# Patient Record
Sex: Male | Born: 1985 | Race: Black or African American | Hispanic: No | Marital: Married | Smoking: Never smoker
Health system: Southern US, Community
[De-identification: ages and names within clinical notes are randomized; demographics above are authoritative.]

## PROBLEM LIST (undated history)

## (undated) DIAGNOSIS — G8929 Other chronic pain: Secondary | ICD-10-CM

## (undated) DIAGNOSIS — R42 Dizziness and giddiness: Secondary | ICD-10-CM

## (undated) DIAGNOSIS — R519 Headache, unspecified: Secondary | ICD-10-CM

## (undated) DIAGNOSIS — M479 Spondylosis, unspecified: Secondary | ICD-10-CM

## (undated) HISTORY — DX: Other chronic pain: G89.29

## (undated) HISTORY — DX: Dizziness and giddiness: R42

## (undated) HISTORY — DX: Spondylosis, unspecified: M47.9

## (undated) HISTORY — DX: Headache, unspecified: R51.9

---

## 2020-10-23 DIAGNOSIS — Z111 Encounter for screening for respiratory tuberculosis: Secondary | ICD-10-CM | POA: Diagnosis not present

## 2020-10-23 DIAGNOSIS — Z23 Encounter for immunization: Secondary | ICD-10-CM | POA: Diagnosis not present

## 2020-11-12 ENCOUNTER — Ambulatory Visit
Admission: RE | Admit: 2020-11-12 | Discharge: 2020-11-12 | Disposition: A | Discharge status: Home / Self Care | Payer: No Typology Code available for payment source | Source: Ambulatory Visit | Attending: Obstetrics and Gynecology | Admitting: Obstetrics and Gynecology

## 2020-11-12 ENCOUNTER — Other Ambulatory Visit: Payer: Self-pay | Admitting: Obstetrics and Gynecology

## 2020-11-12 DIAGNOSIS — R7611 Nonspecific reaction to tuberculin skin test without active tuberculosis: Secondary | ICD-10-CM

## 2020-11-25 DIAGNOSIS — Z23 Encounter for immunization: Secondary | ICD-10-CM | POA: Diagnosis not present

## 2021-02-12 DIAGNOSIS — R42 Dizziness and giddiness: Secondary | ICD-10-CM | POA: Diagnosis not present

## 2021-02-12 DIAGNOSIS — M479 Spondylosis, unspecified: Secondary | ICD-10-CM | POA: Diagnosis not present

## 2021-02-12 DIAGNOSIS — R519 Headache, unspecified: Secondary | ICD-10-CM | POA: Diagnosis not present

## 2021-03-20 DIAGNOSIS — H5211 Myopia, right eye: Secondary | ICD-10-CM | POA: Diagnosis not present

## 2021-03-20 DIAGNOSIS — H5213 Myopia, bilateral: Secondary | ICD-10-CM | POA: Diagnosis not present

## 2021-03-26 ENCOUNTER — Encounter: Payer: Self-pay | Admitting: *Deleted

## 2021-03-31 ENCOUNTER — Ambulatory Visit: Payer: BC Managed Care – PPO | Admitting: Psychiatry

## 2021-04-03 ENCOUNTER — Encounter: Payer: Self-pay | Admitting: Psychiatry

## 2021-04-03 ENCOUNTER — Ambulatory Visit: Payer: BC Managed Care – PPO | Admitting: Psychiatry

## 2021-04-03 VITALS — BP 132/75 | HR 85 | Wt 172.0 lb

## 2021-04-03 DIAGNOSIS — M5412 Radiculopathy, cervical region: Secondary | ICD-10-CM

## 2021-04-03 DIAGNOSIS — M5481 Occipital neuralgia: Secondary | ICD-10-CM

## 2021-04-03 DIAGNOSIS — G4452 New daily persistent headache (NDPH): Secondary | ICD-10-CM

## 2021-04-03 DIAGNOSIS — G43719 Chronic migraine without aura, intractable, without status migrainosus: Secondary | ICD-10-CM | POA: Diagnosis not present

## 2021-04-03 MED ORDER — METHYLPREDNISOLONE 4 MG PO TBPK: ORAL_TABLET | ORAL | 0 refills | Status: DC

## 2021-04-03 NOTE — Patient Instructions (Addendum)
MRI of the head and neck Referral to physical therapy Short course of steroids to help with headaches until we start Botox Start Botox for headache prevention

## 2021-04-03 NOTE — Progress Notes (Signed)
Referring:  Starr Sinclair, MD [address] [place],  [place] [address]  PCP: Starr Sinclair, MD  Neurology was asked to evaluate Raymond Boyd, a 36 year old male for a chief complaint of headaches.  Our recommendations of care will be communicated by shared medical record.    CC:  headaches  HPI:  Medical co-morbidities: none  The patient presents for evaluation of daily headaches which began in 2020. Headaches are described as unilateral throbbing pain R>L. They are associated with photophobia and nausea. He will also sometimes have associated dizziness and vertigo. Headaches can last all day. He also has sharp headaches which can last 3-5 minutes at a time and can occur up to 5 times a day. Feels like he has a foreign body in his left eye, which will occasionally get red and tear up. Went to an eye doctor who told him he had dry eyes.  He also endorses significant neck pain with shooting pain down and numbness down his left arm.  Has tried amitriptyline, topamax, propranolol, and tegretol with temporary relief. He is now just taking a magnesium supplement. Tylenol does not help.  Headache History: Onset: 2020 Triggers: bright lights Aura: no Location: unilateral temple R>L Quality/Description: throbbing Severity: 7/10 Associated Symptoms:  Photophobia: yes  Phonophobia: no  Nausea: yes Vomiting: no Other symptoms: vertigo Worse with activity?: yes Duration of headaches: Hours. Some headaches are 3-5 minutes, can occur 5 times per day.  Headache days per month: 30 Headache free days per month: 0  Current Treatment: Abortive tylenol  Preventative magnesium  Prior Therapies                                 Propranolol Amitriptyline Tegretol Tylenol naproxen Topamax  Headache Risk Factors: Headache risk factors and/or co-morbidities (+) Neck Pain (+) Back Pain (-) History of Traumatic Brain Injury and/or Concussion  LABS: none  IMAGING:   none   Current Outpatient Medications on File Prior to Visit  Medication Sig Dispense Refill   RA NATURAL MAGNESIUM 250 MG TABS 500 mg at bedtime.     No current facility-administered medications on file prior to visit.     Allergies: Allergies  Allergen Reactions   Chloroquine     Family History: Migraine or other headaches in the family:  mother has headaches Aneurysms in a first degree relative:  no Brain tumors in the family:  no Other neurological illness in the family:   no  Past Medical History: Past Medical History:  Diagnosis Date   Chronic headache    Spondylosis    Vertigo     Past Surgical History History reviewed. No pertinent surgical history.  Social History: Social History   Tobacco Use   Smoking status: Never   Smokeless tobacco: Never  Substance Use Topics   Alcohol use: Never   Drug use: Never    ROS: Negative for fevers, chills. Positive for headaches, neck pain. All other systems reviewed and negative unless stated otherwise in HPI.   Physical Exam:   Vital Signs: BP 132/75    Pulse 85    Wt 172 lb (78 kg)  GENERAL: well appearing,in no acute distress,alert SKIN:  Color, texture, turgor normal. No rashes or lesions HEAD:  Normocephalic/atraumatic. CV:  RRR RESP: Normal respiratory effort MSK: no tenderness to palpation over occiput, neck, or shoulders  NEUROLOGICAL: Mental Status: Alert, oriented to person, place and time,Follows commands Cranial  Nerves: PERRL,visual fields intact to confrontation,extraocular movements intact,facial sensation intact,no facial droop or ptosis,hearing grossly intact,no dysarthria Motor: muscle strength 5/5 both upper and lower extremities,no drift, normal tone Reflexes: 2+ throughout Sensation: intact to light touch all 4 extremities Coordination: Finger-to- nose-finger intact bilaterally Gait: normal-based   IMPRESSION: 36 year old male who presents for evaluation of daily headaches. His  headaches have migrainous features including photophobia and nausea. Suspect his shorter headaches are secondary to occipital neuralgia. He does endorse unilateral autonomic symptoms, however they are in the left eye which is the opposite side of his headaches. MRI brain ordered to assess for underlying structural causes of daily right sided headaches. Will also order MRI C-spine as he also endorses radicular symptoms. Referral to neck PT placed to help with his cervicalgia. He has failed multiple oral migraine medications due to lack of efficacy. Will start Botox as this can help with both headaches and neck pain. Medrol dosepak prescribed to help reduce headaches while waiting for his first Botox session.  PLAN: -MRI brain and C-spine -Medrol dosepak -Preventive: Start Botox -Referral to neck PT -Next steps: consider occipital nerve block, gabapentin, CGRP. Consider indomethacin if no improvement despite treatment  I spent a total of 42 minutes chart reviewing and counseling the patient. Headache education was done. Discussed treatment options including preventive and acute medications, and physical therapy. Discussed medication side effects, adverse reactions and drug interactions. Written educational materials and patient instructions outlining all of the above were given.  Follow-up: for Botox   Ocie Doyne, MD 04/03/2021   9:48 AM

## 2021-04-07 ENCOUNTER — Telehealth: Payer: Self-pay | Admitting: Psychiatry

## 2021-04-07 NOTE — Telephone Encounter (Signed)
Dr. Delena Bali would like to start this patient on Botox ([id number], [phone]) for migraine. Dx: G81.856. Patient currently has [employer].

## 2021-04-07 NOTE — Telephone Encounter (Signed)
spoke to the patient he is going to have to think about it due to the cost  [employer] auth: [id number] (exp. 04/04/21 to 05/03/21)

## 2021-04-07 NOTE — Telephone Encounter (Signed)
Completed [employer] PA form for Botox. Placed in Nurse Pod for signature.

## 2021-04-08 NOTE — Telephone Encounter (Signed)
Faxed signed PA form with OV notes to [facility]. Upon approval, patient's Botox prescription will need to be sent to [facility]. Patient will need to sign consent at first injection.

## 2021-04-11 DIAGNOSIS — R1084 Generalized abdominal pain: Secondary | ICD-10-CM | POA: Diagnosis not present

## 2021-04-14 NOTE — Telephone Encounter (Signed)
Received denial from [facility] for Botox. Request does not meet the definition of Medical Necessity. Patient will need to have tried and failed therapy with a CGRP such as Emgailty or Aimovig to prevent migraines. An appeal can be submitted.

## 2021-04-15 ENCOUNTER — Other Ambulatory Visit: Payer: Self-pay | Admitting: Psychiatry

## 2021-04-15 ENCOUNTER — Telehealth: Payer: Self-pay

## 2021-04-15 MED ORDER — AJOVY 225 MG/1.5ML ~~LOC~~ SOAJ: 1.0000 "pen " | SUBCUTANEOUS | 3 refills | Status: DC

## 2021-04-15 MED ORDER — EMGALITY 120 MG/ML ~~LOC~~ SOAJ: 2.0000 "pen " | Freq: Once | SUBCUTANEOUS | 0 refills | Status: AC

## 2021-04-15 MED ORDER — EMGALITY 120 MG/ML ~~LOC~~ SOAJ: 1.0000 "pen " | SUBCUTANEOUS | 3 refills | Status: DC

## 2021-04-15 NOTE — Telephone Encounter (Signed)
Called patient and informed him his insurance denied Botox, explained CGRP Rx and that it will need PA. Advised there are clear directions in box, educated on loading dose. He stated he will call if he has any questions. He then asked about MRI costs. I advised him that Raquel Sarna spoke with him on 04/07/21 and he was going to think about MRI. He still has questions regarding his insurance, cost and scheduling. I advised I will ask Raquel Sarna to all him. Patient verbalized understanding, appreciation.

## 2021-04-15 NOTE — Telephone Encounter (Signed)
I have submitted a PA request for Emgality 120mg /mL on CMM, Key: [id number]. Awaiting determination from [employer].

## 2021-04-15 NOTE — Telephone Encounter (Signed)
We can try a CGRP first. I'll send in a prescription for Emgality to his pharmacy

## 2021-04-15 NOTE — Telephone Encounter (Signed)
LVM requesting he call back for prescription information.

## 2021-04-21 NOTE — Telephone Encounter (Signed)
Patient returned my call he is scheduled at [facility] for 04/29/21.

## 2021-04-21 NOTE — Telephone Encounter (Signed)
Emgality approved, starter kit included in approval. Dates 04/15/21- 07/07/21. Approval letter faxed to pharmacy.

## 2021-04-21 NOTE — Telephone Encounter (Signed)
LVM for pt to call back to schedule.

## 2021-04-21 NOTE — Telephone Encounter (Signed)
Patient is scheduled at [facility] for 04/29/21.

## 2021-04-22 ENCOUNTER — Ambulatory Visit: Payer: BC Managed Care – PPO | Admitting: Psychiatry

## 2021-04-29 ENCOUNTER — Ambulatory Visit: Payer: BC Managed Care – PPO

## 2021-04-29 DIAGNOSIS — G4452 New daily persistent headache (NDPH): Secondary | ICD-10-CM

## 2021-04-29 DIAGNOSIS — M5412 Radiculopathy, cervical region: Secondary | ICD-10-CM | POA: Diagnosis not present

## 2021-04-29 MED ORDER — GADOBENATE DIMEGLUMINE 529 MG/ML IV SOLN
15.0000 mL | Freq: Once | INTRAVENOUS | Status: AC | PRN
  Administered 2021-04-29: 15 mL via INTRAVENOUS

## 2021-05-27 DIAGNOSIS — Z23 Encounter for immunization: Secondary | ICD-10-CM | POA: Diagnosis not present

## 2021-06-23 ENCOUNTER — Telehealth: Payer: Self-pay | Admitting: Psychiatry

## 2021-06-23 NOTE — Telephone Encounter (Signed)
Pt called and LVM stating that he was returning a call to provider Dr. Delena Bali that he received last week. Please advise.  ?

## 2021-06-24 ENCOUNTER — Other Ambulatory Visit: Payer: Self-pay | Admitting: Psychiatry

## 2021-06-24 MED ORDER — RIZATRIPTAN BENZOATE 10 MG PO TABS: 10.0000 mg | ORAL_TABLET | ORAL | 3 refills | Status: DC | PRN

## 2021-06-24 NOTE — Telephone Encounter (Signed)
Contacted pt, informed him there was no call or VM left for him, only results of the scans were sent via mychart. Pt wanted to inform MD that he took his injection on Sunday 4/9 and is still having headaches and wanted to know if there was something else he could do or take to resolve current headache. Please advise  ?

## 2021-06-24 NOTE — Telephone Encounter (Signed)
Pt states he has some questions and would like to talk to a nurse.  ?

## 2021-06-24 NOTE — Telephone Encounter (Signed)
I'm not sure what call he is referring to. I don't see anything in the chart from last week. He can reach out if he has any new concerns, otherwise I don't have any updates for him

## 2021-06-24 NOTE — Telephone Encounter (Signed)
Contacted pt back, informed him rx for Maxalt sent to his pharmacy. He can take this as needed for migraines. Can repeat a dose in 2 hours if headache persists. He should limit Maxalt use to 2 days per week to avoid rebound headaches. Advised to call office back with questions or concerns  ?

## 2021-06-24 NOTE — Telephone Encounter (Signed)
I sent in an rx for Maxalt to his pharmacy. He can take this as needed for migraines. Can repeat a dose in 2 hours if headache persists. He should limit Maxalt use to 2 days per week to avoid rebound headaches

## 2021-06-25 DIAGNOSIS — Z1152 Encounter for screening for COVID-19: Secondary | ICD-10-CM | POA: Diagnosis not present

## 2021-06-25 DIAGNOSIS — U071 COVID-19: Secondary | ICD-10-CM | POA: Diagnosis not present

## 2021-09-19 DIAGNOSIS — H02054 Trichiasis without entropian left upper eyelid: Secondary | ICD-10-CM | POA: Diagnosis not present

## 2021-09-19 DIAGNOSIS — H5713 Ocular pain, bilateral: Secondary | ICD-10-CM | POA: Diagnosis not present

## 2021-09-19 DIAGNOSIS — H02051 Trichiasis without entropian right upper eyelid: Secondary | ICD-10-CM | POA: Diagnosis not present

## 2022-02-09 DIAGNOSIS — M545 Low back pain, unspecified: Secondary | ICD-10-CM | POA: Diagnosis not present

## 2022-02-09 DIAGNOSIS — F430 Acute stress reaction: Secondary | ICD-10-CM | POA: Diagnosis not present

## 2022-02-09 DIAGNOSIS — R1030 Lower abdominal pain, unspecified: Secondary | ICD-10-CM | POA: Diagnosis not present

## 2022-05-14 DIAGNOSIS — R5383 Other fatigue: Secondary | ICD-10-CM | POA: Diagnosis not present

## 2022-05-14 DIAGNOSIS — Z1152 Encounter for screening for COVID-19: Secondary | ICD-10-CM | POA: Diagnosis not present

## 2022-05-14 DIAGNOSIS — J00 Acute nasopharyngitis [common cold]: Secondary | ICD-10-CM | POA: Diagnosis not present

## 2022-05-14 DIAGNOSIS — Z131 Encounter for screening for diabetes mellitus: Secondary | ICD-10-CM | POA: Diagnosis not present

## 2022-05-14 DIAGNOSIS — E559 Vitamin D deficiency, unspecified: Secondary | ICD-10-CM | POA: Diagnosis not present

## 2022-08-03 DIAGNOSIS — R079 Chest pain, unspecified: Secondary | ICD-10-CM | POA: Diagnosis not present

## 2022-08-03 DIAGNOSIS — E559 Vitamin D deficiency, unspecified: Secondary | ICD-10-CM | POA: Diagnosis not present

## 2022-08-14 ENCOUNTER — Ambulatory Visit: Payer: BC Managed Care – PPO | Admitting: Cardiology

## 2022-08-14 ENCOUNTER — Encounter: Payer: Self-pay | Admitting: Cardiology

## 2022-08-14 VITALS — BP 128/81 | HR 60 | Ht 69.0 in | Wt 173.0 lb

## 2022-08-14 DIAGNOSIS — R002 Palpitations: Secondary | ICD-10-CM

## 2022-08-14 DIAGNOSIS — R9431 Abnormal electrocardiogram [ECG] [EKG]: Secondary | ICD-10-CM | POA: Diagnosis not present

## 2022-08-14 DIAGNOSIS — M940 Chondrocostal junction syndrome [Tietze]: Secondary | ICD-10-CM

## 2022-08-14 NOTE — Progress Notes (Unsigned)
Primary Physician/Referring:  Irwin Brakeman, FNP  Patient ID: Raymond Boyd, male    DOB: 06-03-1985, 37 y.o.   MRN: [id number]  Chief Complaint  Patient presents with   Non-cardiac chest pain   Nonspecific abnormal electrocardiogram (ECG) (EKG)   New Patient (Initial Visit)   HPI:    Raymond Boyd  is a 37 y.o. [place] male patient with no past medical history, except in 2020 while he was in [place] was told to have abnormal EKG that is going to be normal as he grows older, referred to me for evaluation of chest pain and palpitations.  Patient states that over the past several years she has had chest pain on the right side of his chest, able to point with his fingers, on and off however in the past couple months he has been having more frequent episodes of chest pain.  It is present with or without physical exertion and no other associated symptoms.  He also complains of palpitations mostly when he is resting or in the evening when he is laying down lasting a few seconds.  There is no family history of sudden cardiac death, no family history of premature coronary artery disease, he has no other symptoms of dyspnea, leg edema, dizziness or syncope.  Past Medical History:  Diagnosis Date   Chronic headache    Spondylosis    Vertigo    History reviewed. No pertinent surgical history. Family History  Problem Relation Age of Onset   Hypertension Mother    Hypertension Father    Diabetes Father    Hypertension Sister    Hypertension Sister     Social History   Tobacco Use   Smoking status: Never   Smokeless tobacco: Never  Substance Use Topics   Alcohol use: Never   Marital Status: Married  ROS  Review of Systems  Cardiovascular:  Positive for chest pain and palpitations. Negative for dyspnea on exertion and leg swelling.   Objective      08/14/2022    8:25 AM 04/03/2021    8:58 AM  Vitals with BMI  Height 5\' 9"    Weight 173 lbs 172 lbs  BMI 25.54   Systolic 128  132  Diastolic 81 75  Pulse 60 85   SpO2: 98 %   Physical Exam Neck:     Vascular: No carotid bruit or JVD.  Cardiovascular:     Rate and Rhythm: Normal rate and regular rhythm.     Pulses: Intact distal pulses.     Heart sounds: Normal heart sounds. No murmur heard.    No gallop.  Pulmonary:     Effort: Pulmonary effort is normal.     Breath sounds: Normal breath sounds.  Chest:     Chest wall: Tenderness (right costochondral junction) present.  Abdominal:     General: Bowel sounds are normal.     Palpations: Abdomen is soft.  Musculoskeletal:     Right lower leg: No edema.     Left lower leg: No edema.     Laboratory examination:   External labs:   NA  Radiology:    Cardiac Studies:  NA  EKG:   EKG 08/14/2022: Normal sinus rhythm at the rate of 63 bpm, normal axis, LVH however probably normal for age.  Compared to PCP EKG 08/03/2022, inferior nonspecific T abnormality not present. TWI only present in III.  PCP EKG 08/03/2022: Normal sinus rhythm at rate of 93 bpm, normal axis, inferior T wave abnormality cannot exclude  inferior ischemia.  LVH probably normal for age.  Early repolarization.  Normal QT interval at 314 ms.  Medications and allergies   Allergies  Allergen Reactions   Chloroquine      Medication list   Current Outpatient Medications:    rizatriptan (MAXALT) 10 MG tablet, Take 1 tablet (10 mg total) by mouth as needed for migraine. May repeat in 2 hours if needed. Max dose 2 pills in 24 hours, Disp: 10 tablet, Rfl: 3  Assessment     ICD-10-CM   1. Chondrocostal junction syndrome (tietze)  M94.0 PCV ECHOCARDIOGRAM COMPLETE    PCV CARDIAC STRESS TEST    2. Nonspecific abnormal electrocardiogram (ECG) (EKG)  R94.31 EKG 12-Lead    PCV ECHOCARDIOGRAM COMPLETE    PCV CARDIAC STRESS TEST    3. Palpitations  R00.2        Orders Placed This Encounter  Procedures   PCV CARDIAC STRESS TEST    Standing Status:   Future    Standing Expiration  Date:   10/14/2022   EKG 12-Lead   PCV ECHOCARDIOGRAM COMPLETE    Standing Status:   Future    Standing Expiration Date:   08/14/2023    No orders of the defined types were placed in this encounter.   Medications Discontinued During This Encounter  Medication Reason   Galcanezumab-gnlm (EMGALITY) 120 MG/ML SOAJ    methylPREDNISolone (MEDROL DOSEPAK) 4 MG TBPK tablet    RA NATURAL MAGNESIUM 250 MG TABS      Recommendations:   [name] is a 37 y.o. [place] male patient with no past medical history, except in 2020 while he was in [place] was told to have abnormal EKG that is going to be normal as he grows older, referred to me for evaluation of chest pain and palpitations.  1. Chondrocostal junction syndrome (tietze) Chest pain is clearly musculoskeletal and reproducible.  I have simply reassured him.  Advised him to use NSAIDs and heat and cold packs as necessary.  No contraindication for him to resume full activity.  - PCV ECHOCARDIOGRAM COMPLETE; Future - PCV CARDIAC STRESS TEST; Future  2. Nonspecific abnormal electrocardiogram (ECG) (EKG) He has an abnormal EKG.  In my experience I have had several people from [place] and [place] that have abnormal EKG similar to this but are not healthy overall.  There is no family history of sudden cardiac death, he is the youngest of the siblings, his oldest brother is 70 years of age and alive and healthy, I have advised the patient to ask his brother to see whether he also has an abnormal EKG.  I will be obtaining an echocardiogram and a routine treadmill exercise stress test and unless abnormal I will see him back on a as needed basis.  In the African health sciences literature, the predominance of EKG abnormalities more predominant in patients with metabolic syndrome.  Patient's diastolic blood pressure is slightly high, advised him to monitor his blood pressure closely.  He has not had lipid profile testing, also in the  literature, there is suggestion that HDL tends to be low in patients with abnormal EKG.  Overall at this point continue primary prevention is indicated.  - EKG 12-Lead - PCV ECHOCARDIOGRAM COMPLETE; Future - PCV CARDIAC STRESS TEST; Future  3. Palpitations Symptoms of palpitations are clearly suggestive of PACs and PVCs mostly occurring at rest and lasting a few seconds.  No further evaluation is indicated.     Yates Decamp, MD, [facility] 08/15/2022,  4:39 PM Office: [phone]

## 2022-08-14 NOTE — Patient Instructions (Signed)
Premature ventricular complexes (PVC) means extra heartbeat from the lower chamber of the heart.  These are very common and are not dangerous.  Extra skipped beat coming from the bottom chamber (ventricle) and mostly are life altering (nuisance) than life threatening and mostly treated by reassurance.  There may not be any specific reasons for this, however patients with excessive caffeine, anxiety, lack of sleep, alcohol or thyroid problems can have these episodes.  Rarely patients with block coronary arteries or family history of heart muscle disease may be the reason.  If more questions, he can discuss with her doctor on office visit.  Reasons for reproducible pain explained to the patient. Heat to tender area. Cold ice compressions to reduce inflammation. Recommended Aleve OTC 1-2 tablets BID for 3-4 days and PRN. Reassured the patient.

## 2022-08-25 DIAGNOSIS — E559 Vitamin D deficiency, unspecified: Secondary | ICD-10-CM | POA: Diagnosis not present

## 2022-08-25 DIAGNOSIS — R079 Chest pain, unspecified: Secondary | ICD-10-CM | POA: Diagnosis not present

## 2022-09-04 ENCOUNTER — Emergency Department (HOSPITAL_COMMUNITY)
Admission: EM | Admit: 2022-09-04 | Discharge: 2022-09-04 | Disposition: A | Discharge status: Home / Self Care | Payer: BC Managed Care – PPO | Attending: Emergency Medicine | Admitting: Emergency Medicine

## 2022-09-04 ENCOUNTER — Ambulatory Visit: Payer: BC Managed Care – PPO

## 2022-09-04 ENCOUNTER — Emergency Department (HOSPITAL_COMMUNITY): Payer: BC Managed Care – PPO

## 2022-09-04 ENCOUNTER — Other Ambulatory Visit: Payer: Self-pay

## 2022-09-04 ENCOUNTER — Encounter (HOSPITAL_COMMUNITY): Payer: Self-pay

## 2022-09-04 ENCOUNTER — Telehealth: Payer: Self-pay

## 2022-09-04 DIAGNOSIS — R918 Other nonspecific abnormal finding of lung field: Secondary | ICD-10-CM | POA: Diagnosis not present

## 2022-09-04 DIAGNOSIS — R0789 Other chest pain: Secondary | ICD-10-CM | POA: Diagnosis not present

## 2022-09-04 DIAGNOSIS — R079 Chest pain, unspecified: Secondary | ICD-10-CM | POA: Diagnosis not present

## 2022-09-04 DIAGNOSIS — R000 Tachycardia, unspecified: Secondary | ICD-10-CM | POA: Diagnosis not present

## 2022-09-04 DIAGNOSIS — M940 Chondrocostal junction syndrome [Tietze]: Secondary | ICD-10-CM

## 2022-09-04 DIAGNOSIS — M549 Dorsalgia, unspecified: Secondary | ICD-10-CM | POA: Diagnosis not present

## 2022-09-04 DIAGNOSIS — R9431 Abnormal electrocardiogram [ECG] [EKG]: Secondary | ICD-10-CM | POA: Diagnosis not present

## 2022-09-04 LAB — CBC
HCT: 43.3 % (ref 39.0–52.0)
Hemoglobin: 14.4 g/dL (ref 13.0–17.0)
MCH: 25.2 pg — ABNORMAL LOW (ref 26.0–34.0)
MCHC: 33.3 g/dL (ref 30.0–36.0)
MCV: 75.8 fL — ABNORMAL LOW (ref 80.0–100.0)
Platelets: 231 10*3/uL (ref 150–400)
RBC: 5.71 MIL/uL (ref 4.22–5.81)
RDW: 12.7 % (ref 11.5–15.5)
WBC: 3.9 10*3/uL — ABNORMAL LOW (ref 4.0–10.5)
nRBC: 0 % (ref 0.0–0.2)

## 2022-09-04 LAB — BASIC METABOLIC PANEL
Anion gap: 9 (ref 5–15)
BUN: 18 mg/dL (ref 6–20)
CO2: 23 mmol/L (ref 22–32)
Calcium: 9.8 mg/dL (ref 8.9–10.3)
Chloride: 104 mmol/L (ref 98–111)
Creatinine, Ser: 1.24 mg/dL (ref 0.61–1.24)
GFR, Estimated: >60 mL/min (ref >60)
Glucose, Bld: 95 mg/dL (ref 70–99)
Potassium: 4.1 mmol/L (ref 3.5–5.1)
Sodium: 136 mmol/L (ref 135–145)

## 2022-09-04 LAB — TROPONIN I (HIGH SENSITIVITY)
Troponin I (High Sensitivity): 4 ng/L (ref <18)
Troponin I (High Sensitivity): 4 ng/L (ref <18)
Troponin I (High Sensitivity): 6 ng/L (ref <18)

## 2022-09-04 MED ORDER — NITROGLYCERIN 0.4 MG SL SUBL
0.4000 mg | SUBLINGUAL_TABLET | Freq: Once | SUBLINGUAL | Status: AC
  Administered 2022-09-04: 0.4 mg via SUBLINGUAL
  Filled 2022-09-04: qty 1

## 2022-09-04 MED ORDER — IOHEXOL 350 MG/ML SOLN
75.0000 mL | Freq: Once | INTRAVENOUS | Status: AC | PRN
  Administered 2022-09-04: 75 mL via INTRAVENOUS

## 2022-09-04 MED ORDER — ASPIRIN 81 MG PO CHEW
324.0000 mg | CHEWABLE_TABLET | Freq: Once | ORAL | Status: AC
  Administered 2022-09-04: 324 mg via ORAL
  Filled 2022-09-04: qty 4

## 2022-09-04 NOTE — ED Provider Notes (Signed)
[place] EMERGENCY DEPARTMENT AT [facility] Provider Note   CSN: [id number] Arrival date & time: 09/04/22  1427     History  Chief Complaint  Patient presents with   Chest Pain    Raymond Boyd is a 37 y.o. male who presents to the ED at request of cardiology for evaluation for possible aortic dissection.  Patient tells me that he has had chest pain for at least the last year.  Describes it as on the right side of his chest, intermittent, and worse for the last 1 to 2 months.  He was evaluated outpatient by cardiology for this in May of this year.  He was told previously when he lived in [place] that he had an abnormal EKG.  No family history of sudden cardiac death.  No history of premature CAD.  Patient denies dyspnea, leg pain or swelling, syncope, lightheadedness, nausea, vomiting, diarrhea, abdominal pain, or other complaints today.  Overall, his chest pain is unchanged from that he is experienced before.  At previous cardiology visit, it was noted that his chest pain was reproducible to palpation and believed to be musculoskeletal.  An outpatient stress test and echocardiogram was ordered.  This was completed today and Dr. Odis Hollingshead with cardiology noted that patient had possible aortic dissection flap prompting him to send the patient to the ED to rule out aortic dissection though there is a low suspicion for this.  If this study is normal, he stated that patient can be discharged home with outpatient follow-up.      Home Medications Prior to Admission medications   Medication Sig Start Date End Date Taking? Authorizing Provider  rizatriptan (MAXALT) 10 MG tablet Take 1 tablet (10 mg total) by mouth as needed for migraine. May repeat in 2 hours if needed. Max dose 2 pills in 24 hours 06/24/21   Ocie Doyne, MD      Allergies    Chloroquine    Review of Systems   Review of Systems  All other systems reviewed and are negative.   Physical Exam Updated Vital  Signs BP (!) 132/91 (BP Location: Right Arm)   Pulse (!) 58   Temp (!) 97.5 F (36.4 C) (Oral)   Resp 19   Ht 5\' 9"  (1.753 m)   Wt 78.5 kg   SpO2 100%   BMI 25.55 kg/m  Physical Exam Vitals and nursing note reviewed.  Constitutional:      General: He is not in acute distress.    Appearance: Normal appearance. He is not ill-appearing, toxic-appearing or diaphoretic.  HENT:     Head: Normocephalic and atraumatic.     Mouth/Throat:     Mouth: Mucous membranes are moist.  Eyes:     Conjunctiva/sclera: Conjunctivae normal.  Neck:     Vascular: No JVD.     Trachea: No tracheal deviation.  Cardiovascular:     Rate and Rhythm: Regular rhythm. Tachycardia present.     Heart sounds: No murmur heard. Pulmonary:     Effort: Pulmonary effort is normal. No respiratory distress.     Breath sounds: Normal breath sounds. No stridor. No decreased breath sounds, wheezing, rhonchi or rales.  Chest:     Chest wall: Tenderness present. No mass, deformity, crepitus or edema.  Abdominal:     General: Abdomen is flat. There is no abdominal bruit.     Palpations: Abdomen is soft. There is no hepatomegaly, splenomegaly or mass.     Tenderness: There is no abdominal tenderness.  There is no guarding or rebound.  Musculoskeletal:        General: Normal range of motion.     Cervical back: Normal range of motion and neck supple.     Right lower leg: No tenderness. No edema.     Left lower leg: No tenderness. No edema.  Skin:    General: Skin is warm and dry.     Capillary Refill: Capillary refill takes less than 2 seconds.  Neurological:     General: No focal deficit present.     Mental Status: He is alert and oriented to person, place, and time. Mental status is at baseline.  Psychiatric:        Mood and Affect: Mood normal.        Behavior: Behavior normal.     ED Results / Procedures / Treatments   Labs (all labs ordered are listed, but only abnormal results are displayed) Labs Reviewed   CBC - Abnormal; Notable for the following components:      Result Value   WBC 3.9 (*)    MCV 75.8 (*)    MCH 25.2 (*)    All other components within normal limits  BASIC METABOLIC PANEL  TROPONIN I (HIGH SENSITIVITY)  TROPONIN I (HIGH SENSITIVITY)  TROPONIN I (HIGH SENSITIVITY)    EKG EKG Interpretation  Date/Time:  Friday September 04 2022 18:31:04 EDT Ventricular Rate:  61 PR Interval:  184 QRS Duration: 90 QT Interval:  363 QTC Calculation: 366 R Axis:   66 Text Interpretation: Sinus rhythm Consider left ventricular hypertrophy Lateral infarct, acute Anterior ST elevation, probably due to LVH >>> Acute MI <<< Confirmed by Bethann Berkshire [phone]) on 09/04/2022 7:03:30 PM  Radiology CT Angio Chest Aorta W and/or Wo Contrast  Result Date: 09/04/2022 CLINICAL DATA:  Chest pain, back pain EXAM: CT ANGIOGRAPHY CHEST WITH CONTRAST TECHNIQUE: Multidetector CT imaging of the chest was performed using the standard protocol during bolus administration of intravenous contrast. Multiplanar CT image reconstructions and MIPs were obtained to evaluate the vascular anatomy. RADIATION DOSE REDUCTION: This exam was performed according to the departmental dose-optimization program which includes automated exposure control, adjustment of the mA and/or kV according to patient size and/or use of iterative reconstruction technique. CONTRAST:  75mL OMNIPAQUE IOHEXOL 350 MG/ML SOLN COMPARISON:  Chest radiograph done earlier today FINDINGS: Cardiovascular: There is no demonstrable mural hematoma in thoracic aorta in the noncontrast images. There is homogeneous enhancement in thoracic aorta. There is no demonstrable intimal flap. Major branches of thoracic aorta and upper abdominal aorta are patent. There are no intraluminal filling defects in central pulmonary artery branches. Mediastinum/Nodes: No significant lymphadenopathy is seen. Lungs/Pleura: Small linear patchy infiltrates are seen in right lower lobe. There is  no pleural effusion or pneumothorax. Upper Abdomen: No acute findings are seen. Musculoskeletal: No acute findings are seen. Review of the MIP images confirms the above findings. IMPRESSION: There is no evidence of thoracic aortic dissection. There is no evidence of central pulmonary artery embolism. Small patchy infiltrates are seen in right lower lobe suggesting atelectasis/pneumonia or scarring. There is no evidence of any large focal pulmonary consolidation. There is no pleural effusion or pneumothorax. Electronically Signed   By: Ernie Avena M.D.   On: 09/04/2022 20:07   DG Chest 2 View  Result Date: 09/04/2022 CLINICAL DATA:  Provided history: Chest pain. EXAM: CHEST - 2 VIEW COMPARISON:  Prior chest radiographs 11/12/2020. FINDINGS: Heart size within normal limits. No appreciable airspace consolidation or pulmonary edema.  No evidence of pleural effusion or pneumothorax. No acute osseous abnormality identified. Mild dextrocurvature of the mid and lower thoracic spine IMPRESSION: 1. No evidence of active cardiopulmonary disease. 2. Mild dextrocurvature of the mid and lower thoracic spine. Electronically Signed   By: Jackey Loge D.O.   On: 09/04/2022 15:31    Procedures Procedures    Medications Ordered in ED Medications  aspirin chewable tablet 324 mg (324 mg Oral Given 09/04/22 1910)  nitroGLYCERIN (NITROSTAT) SL tablet 0.4 mg (0.4 mg Sublingual Given 09/04/22 1910)  iohexol (OMNIPAQUE) 350 MG/ML injection 75 mL (75 mLs Intravenous Contrast Given 09/04/22 1957)    ED Course/ Medical Decision Making/ A&P                             Medical Decision Making Amount and/or Complexity of Data Reviewed Labs: ordered. Decision-making details documented in ED Course. Radiology: ordered. Decision-making details documented in ED Course. ECG/medicine tests: ordered. Decision-making details documented in ED Course.  Risk OTC drugs. Prescription drug management.   Medical Decision  Making:   Raymond Boyd is a 37 y.o. male who presented to the ED today with chest pain detailed above.    Patient's presentation is complicated by their history of abnormal EKG, possible abnormal echo.  Complete initial physical exam performed, notably the patient  was in no acute distress.  He was mildly tachycardic with a regular rhythm but this resolved briefly after initial evaluation.  No lower extremity edema.  No calf tenderness.  No respiratory distress.  Abdomen soft and nontender without palpable masses.  Overall otherwise benign exam.    Reviewed and confirmed nursing documentation for past medical history, family history, social history.    Initial Assessment:   With the patient's presentation of chest pain, the emergent differential diagnosis of chest pain includes: Acute coronary syndrome, pericarditis, aortic dissection, pulmonary embolism, tension pneumothorax, and esophageal rupture.  I do not believe the patient has an emergent cause of chest pain, other urgent/non-acute considerations include, but are not limited to: chronic angina, aortic stenosis, cardiomyopathy, myocarditis, mitral valve prolapse, pulmonary hypertension, hypertrophic obstructive cardiomyopathy (HOCM), aortic insufficiency, right ventricular hypertrophy, pneumonia, pleuritis, bronchitis, pneumothorax, tumor, gastroesophageal reflux disease (GERD), esophageal spasm, Mallory-Weiss syndrome, peptic ulcer disease, biliary disease, pancreatitis, functional gastrointestinal pain, cervical or thoracic disk disease or arthritis, shoulder arthritis, costochondritis, subacromial bursitis, anxiety or panic attack, herpes zoster, breast disorders, chest wall tumors, thoracic outlet syndrome, mediastinitis.    Initial Plan:  Screening labs including CBC and Metabolic panel to evaluate for infectious or metabolic etiology of disease.  CXR to evaluate for structural/infectious intrathoracic pathology.  EKG and troponin to  evaluate for cardiac pathology CT scan to assess for aortic dissection and other intrathoracic pathology Objective evaluation as reviewed   Initial Study Results:   Laboratory  All laboratory results reviewed without evidence of clinically relevant pathology.    EKG EKG was reviewed independently. NSR at 61bpm. Marked as acute MI by EKG machine but in review with attending who co-signed this note appears to be more of a strain pattern in setting of normal troponins, will not call acute MI.    Radiology:  All images reviewed independently. Agree with radiology report at this time.   CT Angio Chest Aorta W and/or Wo Contrast  Result Date: 09/04/2022 CLINICAL DATA:  Chest pain, back pain EXAM: CT ANGIOGRAPHY CHEST WITH CONTRAST TECHNIQUE: Multidetector CT imaging of the chest was performed using the standard  protocol during bolus administration of intravenous contrast. Multiplanar CT image reconstructions and MIPs were obtained to evaluate the vascular anatomy. RADIATION DOSE REDUCTION: This exam was performed according to the departmental dose-optimization program which includes automated exposure control, adjustment of the mA and/or kV according to patient size and/or use of iterative reconstruction technique. CONTRAST:  75mL OMNIPAQUE IOHEXOL 350 MG/ML SOLN COMPARISON:  Chest radiograph done earlier today FINDINGS: Cardiovascular: There is no demonstrable mural hematoma in thoracic aorta in the noncontrast images. There is homogeneous enhancement in thoracic aorta. There is no demonstrable intimal flap. Major branches of thoracic aorta and upper abdominal aorta are patent. There are no intraluminal filling defects in central pulmonary artery branches. Mediastinum/Nodes: No significant lymphadenopathy is seen. Lungs/Pleura: Small linear patchy infiltrates are seen in right lower lobe. There is no pleural effusion or pneumothorax. Upper Abdomen: No acute findings are seen. Musculoskeletal: No acute  findings are seen. Review of the MIP images confirms the above findings. IMPRESSION: There is no evidence of thoracic aortic dissection. There is no evidence of central pulmonary artery embolism. Small patchy infiltrates are seen in right lower lobe suggesting atelectasis/pneumonia or scarring. There is no evidence of any large focal pulmonary consolidation. There is no pleural effusion or pneumothorax. Electronically Signed   By: Ernie Avena M.D.   On: 09/04/2022 20:07   DG Chest 2 View  Result Date: 09/04/2022 CLINICAL DATA:  Provided history: Chest pain. EXAM: CHEST - 2 VIEW COMPARISON:  Prior chest radiographs 11/12/2020. FINDINGS: Heart size within normal limits. No appreciable airspace consolidation or pulmonary edema. No evidence of pleural effusion or pneumothorax. No acute osseous abnormality identified. Mild dextrocurvature of the mid and lower thoracic spine IMPRESSION: 1. No evidence of active cardiopulmonary disease. 2. Mild dextrocurvature of the mid and lower thoracic spine. Electronically Signed   By: Jackey Loge D.O.   On: 09/04/2022 15:31      Consults: Case discussed with Dr. Odis Hollingshead with cardiology who recommended dissection study and if normal patient can be discharged home with outpatient follow-up.   Final Assessment and Plan:   37 year old male presents to the ED with intermittent, chronic chest pain.  Evaluated by cardiology outpatient and sent to the ED after questionable abnormal echocardiogram for rule out of aortic dissection.  Troponins within normal limits.  Discussed EKG with attending physician who co-signed this note who agreed changes appear chronic and does not appear consistent with acute STEMI. Blood work also unremarkable.  Chest x-ray normal.  Patient's vital signs have been reassuring.  He continues to complain of minimal right-sided chest pain but this appears to be unchanged from the pain he has been experiencing for a while now.  CTA does not show evidence  of aortic dissection.  Questionable infiltrates noted to the right lung.  No recent cough, fever.  No shortness of breath.  No known sick contacts.  Patient aware of finding and will follow-up closely with his PCP.  Will not treat with antibiotics at this time as patient is essentially asymptomatic.  Patient instructed to follow-up closely with his PCP and outpatient cardiologist.  Will make outpatient cardiology team aware of findings of patient scan today.  Patient given strict ED return precautions, all questions answered, and stable for discharge.   Clinical Impression:  1. Atypical chest pain   2. Chest wall pain      Discharge           Final Clinical Impression(s) / ED Diagnoses Final diagnoses:  Atypical chest pain  Chest wall pain    Rx / DC Orders ED Discharge Orders     None         Richardson Dopp 09/04/22 2030    Bethann Berkshire, MD 09/08/22 1141

## 2022-09-04 NOTE — Discharge Instructions (Signed)
Thank you for letting us take care of you today.  Your workup was reassuring.  Your heart enzymes were normal.  The CT scan that we did of your chest did not show any major concerns.  Please follow-up with your PCP early next week for reevaluation.  Please continue to follow-up with your cardiologist for further discussion of any continued chest pain or concerns as well as results of your echocardiogram and stress test.  For any new symptoms including worsening chest pain or other new concerns please return to the nearest ED for reevaluation.

## 2022-09-04 NOTE — Telephone Encounter (Signed)
Patient came in today for echocardiogram and stress test.  Echocardiogram was concerning for possible aortic dissection flap - no obvious aortic dilatation but does have mild AI.  The finding was noted on orthogonal views.  Clinically hemodynamically stable but has been having anterior chest pain that goes to the back.  Blood pressures have been within normal limits.  Explained to the patient that the echo findings could be secondary to pathology versus artifact.  But given the symptoms, echo findings, it would be more beneficial to have it evaluated.  Patient was requested to go to ED via EMS for more expedited evaluation but refused.  Since he did not show up in the ED office staff reached out to him and re conveyed its importance.  Katiya Fike Banks, DO, [facility]

## 2022-09-04 NOTE — ED Notes (Signed)
Patient verbalizes understanding of discharge instructions. Opportunity for questioning and answers were provided. Armband removed by staff, pt discharged from ED. Pt ambulatory to ED waiting room with steady gait.  

## 2022-09-04 NOTE — ED Triage Notes (Signed)
Pt arrived POV. C/O R chest pain w/ radiation straight through his back. Onset: intermittently x 1 year, pt seen in May by cardiology and diagnosed w/ PVC's. Description: stabbing. Associated s/s: shob.

## 2022-09-04 NOTE — Telephone Encounter (Signed)
I spoke to the patient and advised him per Dr Odis Hollingshead he needs to go the ER due to his abnormal echocardiogram,patient agreed

## 2022-09-06 NOTE — Progress Notes (Signed)
Echocardiogram 09/04/2022: Normal LV systolic function with visual EF 55-60%. Left ventricle cavity is normal in size. Normal global wall motion. Normal diastolic filling pattern, normal LAP. Mild left ventricular hypertrophy.  Mild (Grade I) aortic regurgitation. Mild (Grade I) mitral regurgitation. No prior study for comparison.

## 2023-02-17 IMAGING — CR DG CHEST 2V
2 series · 2 of 2 positions shown · non-contrast
Comparison: None.

CLINICAL DATA: Positive TB test

EXAM:
CHEST - 2 VIEW

[w chest pa]
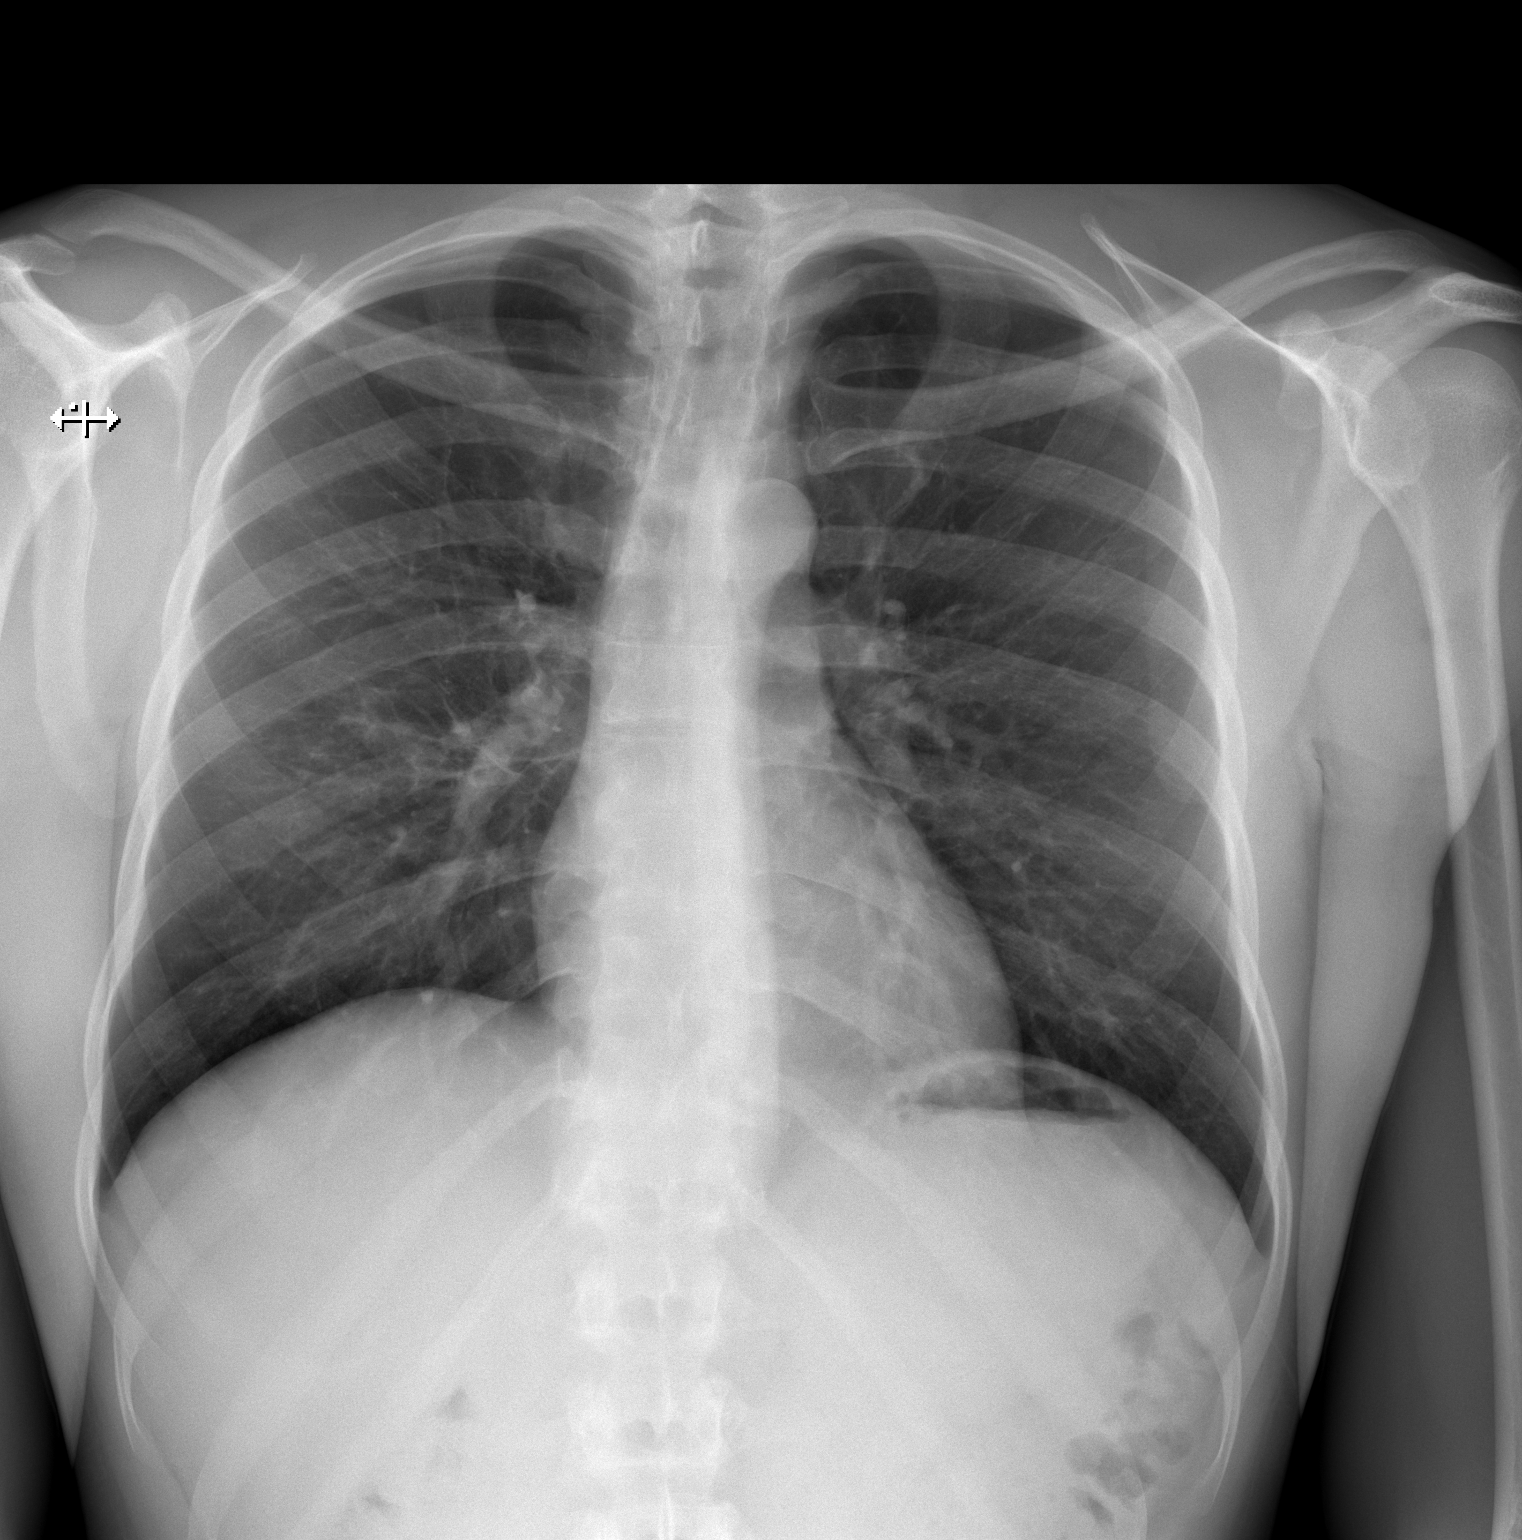

[w chest lat]
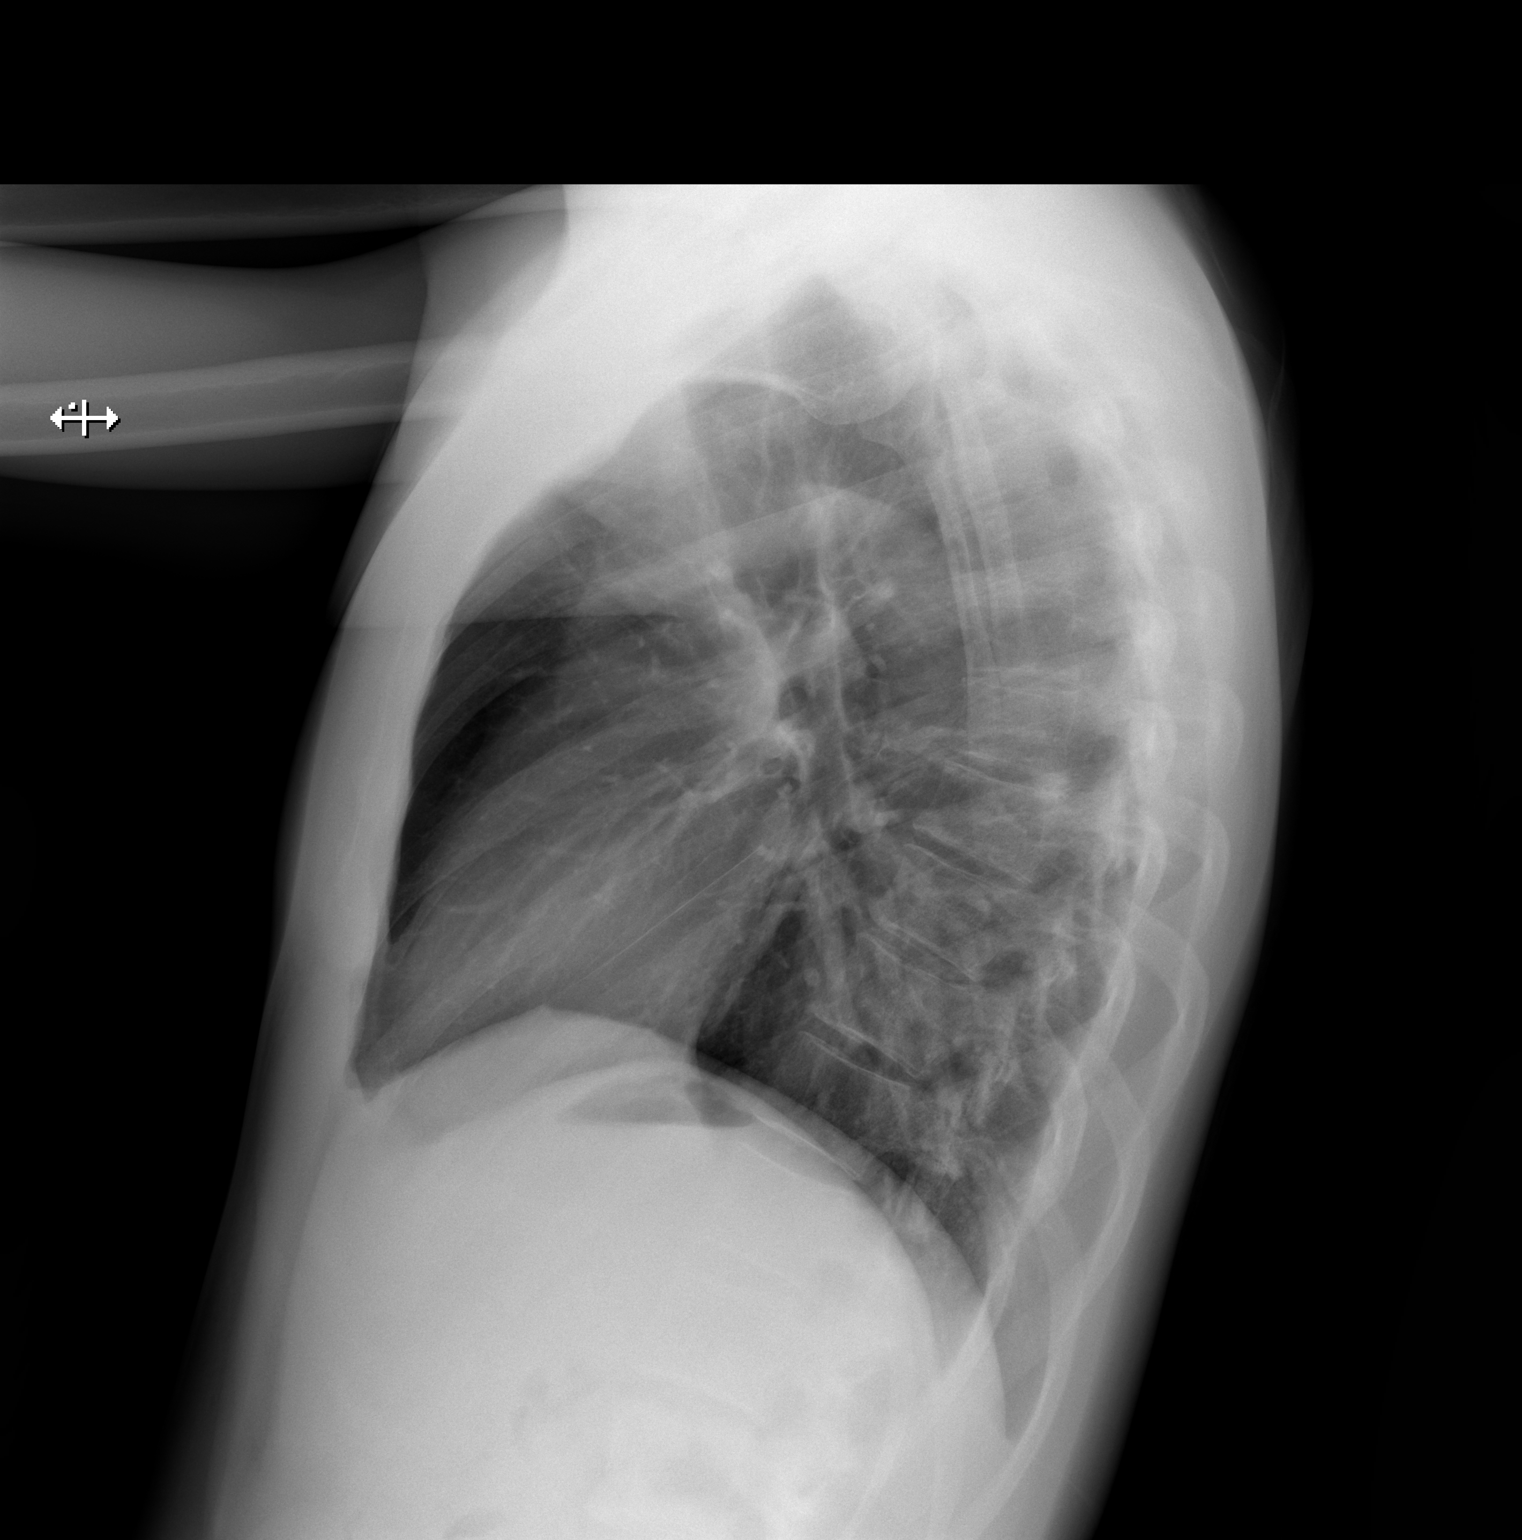

[2 of 2 positions shown; findings below may reference images not displayed]

FINDINGS: The heart size and mediastinal contours are within normal limits.
Both lungs are clear. The visualized skeletal structures are
unremarkable.
IMPRESSION: No active cardiopulmonary disease.

## 2023-05-26 ENCOUNTER — Telehealth: Payer: Self-pay | Admitting: Psychiatry

## 2023-05-26 NOTE — Telephone Encounter (Signed)
 Pt called needing a refill on his rizatriptan (MAXALT) 10 MG tablet and sent to the [facility] [place]

## 2023-05-26 NOTE — Telephone Encounter (Signed)
 Phone room: Pt must make appt to continue getting refills. Otherwise she will need to get from pcp. Thanks,  Production assistant, radio

## 2023-06-08 MED ORDER — RIZATRIPTAN BENZOATE 10 MG PO TABS: 10.0000 mg | ORAL_TABLET | ORAL | 3 refills | Status: AC | PRN

## 2023-06-08 NOTE — Addendum Note (Signed)
 Addended by: Danne Harbor on: 06/08/2023 09:59 AM   Modules accepted: Orders

## 2023-08-24 ENCOUNTER — Ambulatory Visit: Admitting: Neurology

## 2024-03-30 ENCOUNTER — Ambulatory Visit: Admitting: Neurology

## 2024-03-30 ENCOUNTER — Encounter: Payer: Self-pay | Admitting: Neurology

## 2024-03-30 VITALS — BP 123/83 | HR 58 | Ht 70.0 in | Wt 176.2 lb

## 2024-03-30 DIAGNOSIS — Z9189 Other specified personal risk factors, not elsewhere classified: Secondary | ICD-10-CM | POA: Diagnosis not present

## 2024-03-30 DIAGNOSIS — G479 Sleep disorder, unspecified: Secondary | ICD-10-CM | POA: Diagnosis not present

## 2024-03-30 DIAGNOSIS — R519 Headache, unspecified: Secondary | ICD-10-CM

## 2024-03-30 DIAGNOSIS — G43909 Migraine, unspecified, not intractable, without status migrainosus: Secondary | ICD-10-CM

## 2024-03-30 MED ORDER — NORTRIPTYLINE HCL 10 MG PO CAPS: 10.0000 mg | ORAL_CAPSULE | Freq: Every day | ORAL | 5 refills | Status: AC

## 2024-03-30 NOTE — Progress Notes (Signed)
 Subjective:    Patient ID: Raymond Boyd is a 39 y.o. male.    True Mar, MD, PhD [facility] [address] [address] [place], [place] [address]  Dear Dr. Shelia,  I saw your patient, Raymond Boyd, upon your kind request in my neurologic clinic today for evaluation of her headaches.  The patient is unaccompanied today.  As you know, Raymond Boyd is a 39 year old male with an underlying medical history of neck pain, vertigo, headaches, and mildly overweight state, who reports frequent recurrent headaches which start often in the back of his head and the neck area, also sometimes on the left side, very occasionally he has had left-sided numbness.  He has had intermittent symptoms like this, these have been ongoing for years.  He recalls that he has had recurrent HAs since 2021, when he was in [place], treated with vitamins, tried amitriptyline. It made him sleepy, but it was helpful for the headaches and neck pain. Hydrates well with water, about 4 to 5 bottles, 16.9 oz size. He has had a severe headache in late December, when in [place]  for a wedding.  He had Maxalt  available at the time but it did not really provide him as much relief as he was hoping.  He still has some Maxalt  left.  He does not always sleep well.  He wakes up in the middle of the night, no trouble falling asleep but does have trouble staying asleep.  He is not sure about snoring.  No one has complained about it.  He goes to bed generally between midnight and 1 and rise time is between 7 and 8, sometimes earlier, depending on his schedule.  He is a games developer at [employer].  He has occasionally woken up with a headache.  He does not have nightly nocturia.  I reviewed your office note from 02/22/2024, you presented to student health.  His blood pressure was initially mildly elevated at 142/92, upon recheck it was 132/87. He had seen Dr. Delon Nurse in this clinic about 2 years ago in this  clinic.  He was advised to start Botox for migraine prevention.  Occipital nerve block was discussed as well as the use of gabapentin and CGRP inhibitors.  A brain MRI and cervical spine MRI were ordered and he was treated with a Medrol  Dosepak at the time, he was referred to physical therapy for neck pain. He had a cervical spine MRI without contrast and brain MRI without contrast through our office on 04/29/2021 and I reviewed the results:  IMPRESSION:    Normal MRI cervical spine (with and without).    Normal MRI brain (with and without).    His insurance did not approve Botox injections he states.   He does not smoke, denies vaping, utilizing any THC or CBD products.  He does not drink alcohol.  He limits his caffeine, does not have to have caffeine every day. He is up-to-date with his eye examination, saw an optometrist in or around August or September 2025.  He has prescription eyeglasses that he uses typically when at the computer.  Previously (copied from previous notes for reference):    04/03/2021 (Dr. Delon Nurse): << HPI:  Medical co-morbidities: none   The patient presents for evaluation of daily headaches which began in 2020. Headaches are described as unilateral throbbing pain R>L. They are associated with photophobia and nausea. He will also sometimes have associated dizziness and vertigo. Headaches can last all day. He  also has sharp headaches which can last 3-5 minutes at a time and can occur up to 5 times a day. Feels like he has a foreign body in his left eye, which will occasionally get red and tear up. Went to an eye doctor who told him he had dry eyes.   He also endorses significant neck pain with shooting pain down and numbness down his left arm.   Has tried amitriptyline, topamax, propranolol, and tegretol with temporary relief. He is now just taking a magnesium supplement. Tylenol does not help.   Headache History: Onset: 2020 Triggers: bright lights Aura:  no Location: unilateral temple R>L Quality/Description: throbbing Severity: 7/10 Associated Symptoms:             Photophobia: yes             Phonophobia: no             Nausea: yes Vomiting: no Other symptoms: vertigo Worse with activity?: yes Duration of headaches: Hours. Some headaches are 3-5 minutes, can occur 5 times per day.   Headache days per month: 30 Headache free days per month: 0   Current Treatment: Abortive tylenol   Preventative magnesium   Prior Therapies                                 Propranolol Amitriptyline Tegretol Tylenol naproxen Topamax   Headache Risk Factors: Headache risk factors and/or co-morbidities (+) Neck Pain (+) Back Pain (-) History of Traumatic Brain Injury and/or Concussion   LABS: none   IMAGING:  none     >>   His Past Medical History Is Significant For: Past Medical History:  Diagnosis Date   Chronic headache    Spondylosis    Vertigo     His Past Surgical History Is Significant For: History reviewed. No pertinent surgical history.  His Family History Is Significant For: Family History  Problem Relation Age of Onset   Hypertension Mother    Hypertension Father    Diabetes Father    Hypertension Sister    Hypertension Sister    Migraines Neg Hx    Sleep apnea Neg Hx    Headache Neg Hx     His Social History Is Significant For: Social History   Socioeconomic History   Marital status: Married    Spouse name: Not on file   Number of children: 2   Years of education: Not on file   Highest education level: Master's degree (e.g., MA, MS, MEng, MEd, MSW, MBA)  Occupational History    Comment: studying for PhD  Tobacco Use   Smoking status: Never   Smokeless tobacco: Never  Vaping Use   Vaping status: Never Used  Substance and Sexual Activity   Alcohol use: Never   Drug use: Never   Sexual activity: Not on file  Other Topics Concern   Not on file  Social History Narrative   Moved here from  [place], l   Caffeine- tea occas   Pt lives alone    Pt works    Social Drivers of Health   Tobacco Use: Low Risk (03/30/2024)   Patient History    Smoking Tobacco Use: Never    Smokeless Tobacco Use: Never    Passive Exposure: Not on file  Financial Resource Strain: Not on file  Food Insecurity: Not on file  Transportation Needs: Not on file  Physical Activity: Not on file  Stress: Not on file  Social Connections: Not on file  Depression (EYV7-0): Not on file  Alcohol Screen: Not on file  Housing: Not on file  Utilities: Not on file  Health Literacy: Not on file    His Allergies Are:  Allergies[1]:   His Current Medications Are:  Outpatient Encounter Medications as of 03/30/2024  Medication Sig   rizatriptan  (MAXALT ) 10 MG tablet Take 1 tablet (10 mg total) by mouth as needed for migraine. May repeat in 2 hours if needed. Max dose 2 pills in 24 hours   No facility-administered encounter medications on file as of 03/30/2024.  :   Review of Systems:  Out of a complete 14 point review of systems, all are reviewed and negative with the exception of these symptoms as listed below:   Review of Systems  Objective:  Neurological Exam  Physical Exam Physical Examination:   Vitals:   03/30/24 1257  BP: 123/83  Pulse: (!) 58    General Examination: The patient is a very pleasant 39 y.o. male in no acute distress. He appears well-developed and well-nourished and well groomed.   HEENT: Normocephalic, atraumatic, pupils are equal, round and reactive to light, extraocular tracking is good without limitation to gaze excursion or nystagmus noted. No photophobia.  Funduscopic exam benign.  Possible mild bilateral/beginning cataracts. No Corrective eye glasses in place. Hearing is grossly intact.  Face is symmetric with normal facial animation and normal sensation to light touch, temperature and vibration sense. Speech is clear without dysarthria. There is no hypophonia. There  is no lip, neck/head, jaw or voice tremor. Neck is supple with full range of passive and active motion. There are no carotid bruits on auscultation.  Airway/Oropharynx exam reveals: mild mouth dryness, good dental hygiene and mild airway crowding, due to small airway entry, Mallampati class II, tonsils on the smaller side, tongue protrudes centrally and palate elevates symmetrically.  Chest: Clear to auscultation without wheezing, rhonchi or crackles noted.  Heart: S1+S2+0, regular and normal without murmurs, rubs or gallops noted.   Abdomen: Soft, non-tender and non-distended.  Extremities: There is no pitting edema in the distal lower extremities bilaterally.   Skin: Warm and dry without trophic changes noted.   Musculoskeletal: exam reveals no obvious joint deformities.   Neurologically:  Mental status: The patient is awake, alert and oriented in all 4 spheres. His immediate and remote memory, attention, language skills and fund of knowledge are appropriate. There is no evidence of aphasia, agnosia, apraxia or anomia. Speech is clear with normal prosody and enunciation. Thought process is linear. Mood is normal and affect is normal.  Cranial nerves II - XII are as described above under HEENT exam.  Motor exam: Normal bulk, strength and tone is noted. There is no obvious action or resting tremor.  No drift or rebound, no postural or intention tremor. Reflexes 2+ throughout, toes are downgoing bilaterally. Romberg negative. Fine motor skills and coordination: Intact finger taps, hand movements and rapid alternating patting with both upper extremities, normal foot taps bilaterally in the lower extremities.  Cerebellar testing: No dysmetria or intention tremor. There is no truncal or gait ataxia.  Normal finger-to-nose, normal heel-to-shin bilaterally. Sensory exam: intact to light touch, temperature and vibration sense in the upper and lower extremities.  Gait, station and balance: He stands  easily. No veering to one side is noted. No leaning to one side is noted. Posture is age-appropriate and stance is narrow based. Gait shows normal stride length and normal  pace. No problems turning are noted.  Normal tandem walk.  Assessment and Plan:   In summary, Livingston Denner is a very pleasant 39 year old male with an underlying medical history of neck pain, vertigo, headaches, and mildly overweight state, who presents for evaluation of his recurrent headaches.  He has a history of recurrent migrainous and tension type headaches for the past 5 years approximately.  Neurological exam is nonfocal.  He has had intermittent left-sided numbness.  He had a benign brain MRI in 2023.  He is up-to-date with his eye examination.  I had a long discussion with the patient regarding headache triggers and alleviating factors and lifestyle modification, sleep deprivation and sleep disordered breathing.   This was an extended visit of over 60 minutes with copious record review involved including paper chart review and prior electronic neurology record review, personal review of imaging tests, comprehensive exam and considerable counseling and coordination of care.  Below is a summary of my recommendations and our discussion points from today's visit, based on chart review, history and examination. They were given these instructions verbally during the visit in detail and also in writing in the MyChart after visit summary (AVS), which they can access electronically. <<    Please remember, common headache triggers are: sleep deprivation, dehydration, overheating, stress, hypoglycemia or skipping meals and blood sugar fluctuations, excessive pain medications or excessive alcohol use or caffeine withdrawal. Some people have food triggers such as aged cheese, orange juice or chocolate, especially dark chocolate, or MSG (monosodium glutamate). Try to avoid these headache triggers as much possible. It may be helpful to keep  a headache diary to figure out what makes your headaches worse or brings them on and what alleviates them. Some people report headache onset after exercise but studies have shown that regular exercise may actually prevent headaches from coming. If you have exercise-induced headaches, please make sure that you drink plenty of fluid before and after exercising and that you do not over do it and do not overheat. Please utilize over-the-counter medications such as Tylenol and Advil sparingly and only as needed.  You can utilize Maxalt /rizatriptan  for an acute migraine as needed.  Stay up-to-date with your eye examination for this year.  Continue to hydrate well with water, try to aim for 64 to 80 ounces of water per day on average.  Limit your caffeine as you are.  Try to get 7 to 8 hours of sleep on any given night.  We will do a brain scan, called MRI and call you with the test results. We will have to schedule you for this on a separate date. This test requires authorization from your insurance, and we will take care of the insurance process. I will order a home sleep test to look for signs of obstructive sleep apnea (aka OSA). As explained, the long-term risks and ramifications of untreated moderate to severe obstructive sleep apnea may include (but are not limited to): increased risk for cardiovascular disease, including congestive heart failure, stroke, difficult to control hypertension, treatment resistant obesity, arrhythmias, especially irregular heartbeat commonly known as A. Fib. (atrial fibrillation); even type 2 diabetes has been linked to untreated OSA.  If you have obstructive sleep apnea, I would likely offer you treatment with a so-called AutoPap machine. For headache prevention, I suggest we try you on a low-dose of nortriptyline  10 mg strength, take 1 pill at bedtime. Common side effects reported are: mouth dryness, drowsiness, confusion, dizziness.  We will plan a follow up  in about 6 months  in this clinic for you to see one of our nurse practitioners for checkup.  In the meantime, we will keep you posted as to your sleep test results and home sleep test results by phone call and/or MyChart message. >>    I answered all his questions today and he was in agreement with our plan. Thank you very much for allowing me to participate in the care of this nice patient. If I can be of any further assistance to you please do not hesitate to call me at [phone].  Sincerely,   True Mar, MD, PhD     [1]  Allergies Allergen Reactions   Chloroquine

## 2024-03-30 NOTE — Patient Instructions (Signed)
 It was nice to meet you today.   As discussed, your headaches are likely due to a combination of factors.   Here is what we discussed today and my recommendations for you:   Please remember, common headache triggers are: sleep deprivation, dehydration, overheating, stress, hypoglycemia or skipping meals and blood sugar fluctuations, excessive pain medications or excessive alcohol use or caffeine withdrawal. Some people have food triggers such as aged cheese, orange juice or chocolate, especially dark chocolate, or MSG (monosodium glutamate). Try to avoid these headache triggers as much possible. It may be helpful to keep a headache diary to figure out what makes your headaches worse or brings them on and what alleviates them. Some people report headache onset after exercise but studies have shown that regular exercise may actually prevent headaches from coming. If you have exercise-induced headaches, please make sure that you drink plenty of fluid before and after exercising and that you do not over do it and do not overheat. Please utilize over-the-counter medications such as Tylenol and Advil sparingly and only as needed.  You can utilize Maxalt /rizatriptan  for an acute migraine as needed.  Stay up-to-date with your eye examination for this year.  Continue to hydrate well with water, try to aim for 64 to 80 ounces of water per day on average.  Limit your caffeine as you are.  Try to get 7 to 8 hours of sleep on any given night.  We will do a brain scan, called MRI and call you with the test results. We will have to schedule you for this on a separate date. This test requires authorization from your insurance, and we will take care of the insurance process. I will order a home sleep test to look for signs of obstructive sleep apnea (aka OSA). As explained, the long-term risks and ramifications of untreated moderate to severe obstructive sleep apnea may include (but are not limited to): increased risk for  cardiovascular disease, including congestive heart failure, stroke, difficult to control hypertension, treatment resistant obesity, arrhythmias, especially irregular heartbeat commonly known as A. Fib. (atrial fibrillation); even type 2 diabetes has been linked to untreated OSA.  If you have obstructive sleep apnea, I would likely offer you treatment with a so-called AutoPap machine. For headache prevention, I suggest we try you on a low-dose of nortriptyline  10 mg strength, take 1 pill at bedtime. Common side effects reported are: mouth dryness, drowsiness, confusion, dizziness.  We will plan a follow up in about 6 months in this clinic for you to see one of our nurse practitioners for checkup.  In the meantime, we will keep you posted as to your sleep test results and home sleep test results by phone call and/or MyChart message.

## 2024-04-04 ENCOUNTER — Other Ambulatory Visit

## 2024-04-04 DIAGNOSIS — Z9189 Other specified personal risk factors, not elsewhere classified: Secondary | ICD-10-CM

## 2024-04-04 DIAGNOSIS — G43909 Migraine, unspecified, not intractable, without status migrainosus: Secondary | ICD-10-CM | POA: Diagnosis not present

## 2024-04-04 DIAGNOSIS — R519 Headache, unspecified: Secondary | ICD-10-CM

## 2024-04-04 DIAGNOSIS — G479 Sleep disorder, unspecified: Secondary | ICD-10-CM

## 2024-04-04 MED ORDER — GADOBENATE DIMEGLUMINE 529 MG/ML IV SOLN
16.0000 mL | Freq: Once | INTRAVENOUS | Status: AC | PRN
  Administered 2024-04-04: 16 mL via INTRAVENOUS

## 2024-04-07 ENCOUNTER — Ambulatory Visit: Payer: Self-pay | Admitting: Neurology

## 2024-04-14 ENCOUNTER — Encounter

## 2024-04-14 DIAGNOSIS — G479 Sleep disorder, unspecified: Secondary | ICD-10-CM

## 2024-04-14 DIAGNOSIS — Z9189 Other specified personal risk factors, not elsewhere classified: Secondary | ICD-10-CM

## 2024-04-14 DIAGNOSIS — R519 Headache, unspecified: Secondary | ICD-10-CM

## 2024-04-14 DIAGNOSIS — G43909 Migraine, unspecified, not intractable, without status migrainosus: Secondary | ICD-10-CM

## 2024-10-11 ENCOUNTER — Ambulatory Visit: Admitting: Adult Health
